# Patient Record
Sex: Female | Born: 1970 | Race: White | Hispanic: No | Marital: Married | Smoking: Never smoker
Health system: Southern US, Community
[De-identification: ages and names within clinical notes are randomized; demographics above are authoritative.]

## PROBLEM LIST (undated history)

## (undated) DIAGNOSIS — L02419 Cutaneous abscess of limb, unspecified: Secondary | ICD-10-CM

## (undated) DIAGNOSIS — K469 Unspecified abdominal hernia without obstruction or gangrene: Secondary | ICD-10-CM

## (undated) HISTORY — DX: Unspecified abdominal hernia without obstruction or gangrene: K46.9

## (undated) HISTORY — DX: Cutaneous abscess of limb, unspecified: L02.419

## (undated) HISTORY — PX: NEPHRECTOMY: SHX65

## (undated) HISTORY — PX: BREAST BIOPSY: SHX20

---

## 2006-05-18 ENCOUNTER — Other Ambulatory Visit: Admission: RE | Admit: 2006-05-18 | Discharge: 2006-05-18 | Payer: Self-pay | Admitting: Internal Medicine

## 2007-07-19 ENCOUNTER — Other Ambulatory Visit: Admission: RE | Admit: 2007-07-19 | Discharge: 2007-07-19 | Payer: Self-pay | Admitting: Internal Medicine

## 2008-07-24 ENCOUNTER — Other Ambulatory Visit: Admission: RE | Admit: 2008-07-24 | Discharge: 2008-07-24 | Payer: Self-pay | Admitting: Internal Medicine

## 2009-07-27 ENCOUNTER — Other Ambulatory Visit: Admission: RE | Admit: 2009-07-27 | Discharge: 2009-07-27 | Payer: Self-pay | Admitting: Internal Medicine

## 2009-08-05 ENCOUNTER — Encounter: Admission: RE | Admit: 2009-08-05 | Discharge: 2009-08-05 | Payer: Self-pay | Admitting: Internal Medicine

## 2010-09-24 ENCOUNTER — Other Ambulatory Visit: Payer: Self-pay | Admitting: Surgery

## 2010-09-24 ENCOUNTER — Encounter (HOSPITAL_COMMUNITY): Payer: Managed Care, Other (non HMO) | Attending: Surgery

## 2010-09-24 DIAGNOSIS — K439 Ventral hernia without obstruction or gangrene: Secondary | ICD-10-CM | POA: Insufficient documentation

## 2010-09-24 DIAGNOSIS — Z905 Acquired absence of kidney: Secondary | ICD-10-CM | POA: Insufficient documentation

## 2010-09-24 DIAGNOSIS — Z01812 Encounter for preprocedural laboratory examination: Secondary | ICD-10-CM | POA: Insufficient documentation

## 2010-09-24 LAB — BASIC METABOLIC PANEL
BUN: 9 mg/dL (ref 6–23)
CO2: 27 mEq/L (ref 19–32)
Chloride: 106 mEq/L (ref 96–112)
GFR calc Af Amer: >60 mL/min (ref >60)
GFR calc non Af Amer: 55 mL/min — ABNORMAL LOW (ref >60)
Glucose, Bld: 95 mg/dL (ref 70–99)
Potassium: 4.5 mEq/L (ref 3.5–5.1)
Sodium: 138 mEq/L (ref 135–145)

## 2010-09-24 LAB — CBC
HCT: 41.5 % (ref 36.0–46.0)
Platelets: 243 10*3/uL (ref 150–400)
RBC: 4.86 MIL/uL (ref 3.87–5.11)

## 2010-09-24 LAB — SURGICAL PCR SCREEN: Staphylococcus aureus: NEGATIVE

## 2010-09-24 LAB — HCG, SERUM, QUALITATIVE: Preg, Serum: NEGATIVE

## 2010-09-29 ENCOUNTER — Ambulatory Visit (HOSPITAL_COMMUNITY)
Admission: RE | Admit: 2010-09-29 | Discharge: 2010-09-30 | Disposition: A | Discharge status: Home / Self Care | Payer: Managed Care, Other (non HMO) | Source: Ambulatory Visit | Attending: Surgery | Admitting: Surgery

## 2010-09-29 DIAGNOSIS — K436 Other and unspecified ventral hernia with obstruction, without gangrene: Secondary | ICD-10-CM | POA: Insufficient documentation

## 2010-09-29 DIAGNOSIS — Z905 Acquired absence of kidney: Secondary | ICD-10-CM | POA: Insufficient documentation

## 2010-10-08 NOTE — Op Note (Signed)
Erin Sheppard, Erin Sheppard              ACCOUNT NO.:  [id number]  MEDICAL RECORD NO.:  [id number]           PATIENT TYPE:  O  LOCATION:  DAYL                         FACILITY:  [facility]  PHYSICIAN:  Thornton Park. 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  POSTOPERATIVE DIAGNOSIS:  Incarcerated hernia with omentum.  PROCEDURE:  Laparoscopic takedown of adhesions and incarceration, open cutdown and removal of sac, relaxing incisions through rectus abdominis with primary interrupted Novafil closure.  Prior to closure, insertion of a 12-cm round Parietex mesh patch anchored superiorly and inferiorly 2 cm above the defect with #1 Novafil, internal tacking with a SecureStrap absorbable tacker, onlay of Bard Marlex mesh on top of the areas of the relaxed incisions crossing the midline and across the defect.  Anchored with interrupted #1 Novafil.  SURGEON:  Thornton Park. Daphine Deutscher, MD  ASSISTANT:  None.  ANESTHESIA:  General endotracheal.  DESCRIPTION OF PROCEDURE:  Erin Sheppard is a 40 year old white female, brought to room 11 on Wednesday, Sep 29, 2010, and given general anesthesia.  Abdomen was prepped with PC max and draped sterilely.  I entered the abdomen through the left upper quadrant using a 5-mm 0- degree Optiview with a little difficulty and that I actually entered into a bunch of adhesions which were avascular.  I did not feel in any way that I injured the bowel, but looked afterward the fact.  These were omental adhesions all across the left upper quadrant and across to the extraction site and they even extended down into the pelvis, but I was able to insufflate and then I placed again using an Optiview technique, a 5 over on the right side and then 1 inferiorly.  These enabled me  to visualize the adhesions.  I took these down with Harmonic scalpel.  I found them stuck up into the hernia sac, and I again took those down with Harmonic and reduced them.  Everything looked to be in order. There was about a 6 cm defect in the midline.  I elected then to try to restore Erin Sheppard functional abdomen by reducing the muscles back together.  I excised Erin Sheppard old scar around Erin Sheppard umbilicus and then went out and dissected free this large hernia sac.  I removed it and then this revealed a nice fascial defect.  This point, 3 trocars stayed in place.  I went ahead and placed #1 Novafil sutures which would essentially repair the defect.  I went out laterally in the rectus sheath in this area and made an anterior relaxing incision to facilitate this less tension closure.  Prior to tying these, however, I decided that I would insert a 12 cm Parietex, and I oriented it with the coated side to the bowel and then placed 2 sutures 2 cm cephalad to the upper limits of the defect still leaving a little bit of mesh on the upside of that to give me probably about 3 cm above and below  coverage over the closure.  Once these were tied, the mesh was oriented in up and down direction.  I then tied all of these sutures approximating the midline defect nicely.  I then reinflated the abdomen.  I was able to use a SecureStrap to tack the Parietex mesh laterally using one and half tackers to get that done. It was lying nicely and covered the defect completely.  Next, I did put a piece of Bard  Marlex type mesh cutting it to fit right across the defect as well as wide enough to cover the 2 areas of relaxed incisions. I tacked these anterior fascia laterally with 2 sutures on either side and then the midline fascia with a single up and down stitch.  Wound was irrigated with copious saline.  When I put in the Marlex mesh, I had the abdomen deflated.  I then closed the wound with 4-0 Vicryl subcutaneously and  with a running subcuticular 5-0 Monocryl.  Dermabond was used on the incisions.  The 3 trocars incisions were closed with 4-0 Vicryl and with Dermabond.  The patient tolerated the procedure well, was taken to recovery room in satisfactory condition.  Erin Sheppard will be considered for discharge and will be given a prescription for Percocet if needed.     Thornton Park Daphine Deutscher, MD     MBM/MEDQ  D:  09/29/2010  T:  09/29/2010  Job:  [id number]  Electronically Signed by Luretha Murphy MD on 10/08/2010 08:35:42 AM

## 2010-10-08 NOTE — Discharge Summary (Signed)
  NAMEDEANNE, Erin Sheppard              ACCOUNT NO.:  [id number]  MEDICAL RECORD NO.:  [id number]           PATIENT TYPE:  O  LOCATION:  1533                         FACILITY:  [facility]  PHYSICIAN:  Thornton Park. Daphine Deutscher, MD  DATE OF BIRTH:  [birth year]  DATE OF ADMISSION:  09/29/2010 DATE OF DISCHARGE:  09/30/2010                              DISCHARGE SUMMARY   ADMITTING DIAGNOSIS:  Ventral hernia following a donor nephrectomy.  HISTORY:  This 40 year old white female came into the hospital on Sep 29, 2010, and underwent a laparoscopically-assisted ventral hernia repair using both Parietex internal placement and an external Bard mesh.  The patient tolerated the procedure well.  She looks good on postop day #1 and was ready for discharge.  She is be given Percocet 5/325 #30 to take for pain.  She will be followed up in the office in 2 to 3 weeks.  CONDITION ON DISCHARGE:  Good.     Thornton Park Daphine Deutscher, MD     MBM/MEDQ  D:  09/30/2010  T:  09/30/2010  Job:  [id number]  Electronically Signed by Luretha Murphy MD on 10/08/2010 08:35:39 AM

## 2010-11-12 ENCOUNTER — Encounter (INDEPENDENT_AMBULATORY_CARE_PROVIDER_SITE_OTHER): Payer: Self-pay | Admitting: Surgery

## 2010-12-23 ENCOUNTER — Ambulatory Visit (INDEPENDENT_AMBULATORY_CARE_PROVIDER_SITE_OTHER): Payer: Commercial Indemnity | Admitting: Surgery

## 2010-12-23 ENCOUNTER — Encounter (INDEPENDENT_AMBULATORY_CARE_PROVIDER_SITE_OTHER): Payer: Self-pay | Admitting: Surgery

## 2010-12-23 DIAGNOSIS — Z8719 Personal history of other diseases of the digestive system: Secondary | ICD-10-CM

## 2010-12-23 DIAGNOSIS — Z9889 Other specified postprocedural states: Secondary | ICD-10-CM

## 2010-12-23 NOTE — Progress Notes (Signed)
Mrs. Erin Sheppard was worked into and office since she was overlooked and. Anyway she was a good sport about having to wait to be seen.  I had repaired a hernia where she had a donor nephrectomy to get to her brother. Her brother is doing very well with her kidney. The repair she says sometimes pulls which I expect with her mesh shrinkage. The repair is intact and she seems to be doing well I will be glad to see her again as needed. Return p.r.n.

## 2011-09-06 ENCOUNTER — Other Ambulatory Visit: Payer: Self-pay | Admitting: Internal Medicine

## 2011-09-06 ENCOUNTER — Other Ambulatory Visit (HOSPITAL_COMMUNITY)
Admission: RE | Admit: 2011-09-06 | Discharge: 2011-09-06 | Disposition: A | Discharge status: Home / Self Care | Payer: Commercial Indemnity | Source: Ambulatory Visit | Attending: Internal Medicine | Admitting: Internal Medicine

## 2011-09-06 DIAGNOSIS — Z1159 Encounter for screening for other viral diseases: Secondary | ICD-10-CM | POA: Insufficient documentation

## 2011-09-06 DIAGNOSIS — Z1231 Encounter for screening mammogram for malignant neoplasm of breast: Secondary | ICD-10-CM

## 2011-09-06 DIAGNOSIS — Z01419 Encounter for gynecological examination (general) (routine) without abnormal findings: Secondary | ICD-10-CM | POA: Insufficient documentation

## 2011-09-14 ENCOUNTER — Ambulatory Visit
Admission: RE | Admit: 2011-09-14 | Discharge: 2011-09-14 | Disposition: A | Discharge status: Home / Self Care | Payer: Commercial Indemnity | Source: Ambulatory Visit | Attending: Internal Medicine | Admitting: Internal Medicine

## 2011-09-14 DIAGNOSIS — Z1231 Encounter for screening mammogram for malignant neoplasm of breast: Secondary | ICD-10-CM

## 2012-03-05 ENCOUNTER — Encounter (INDEPENDENT_AMBULATORY_CARE_PROVIDER_SITE_OTHER): Payer: Self-pay | Admitting: General Surgery

## 2012-03-05 ENCOUNTER — Ambulatory Visit (INDEPENDENT_AMBULATORY_CARE_PROVIDER_SITE_OTHER): Payer: Commercial Indemnity | Admitting: General Surgery

## 2012-03-05 VITALS — BP 132/76 | HR 76 | Temp 98.0°F | Resp 18 | Ht 66.0 in | Wt 184.4 lb

## 2012-03-05 DIAGNOSIS — IMO0002 Reserved for concepts with insufficient information to code with codable children: Secondary | ICD-10-CM

## 2012-03-05 DIAGNOSIS — L02419 Cutaneous abscess of limb, unspecified: Secondary | ICD-10-CM

## 2012-03-05 MED ORDER — HYDROCODONE-ACETAMINOPHEN 5-325 MG PO TABS: 1.0000 | ORAL_TABLET | Freq: Four times a day (QID) | ORAL | Status: DC | PRN

## 2012-03-05 NOTE — Progress Notes (Signed)
Patient ID: Erin Sheppard, female   DOB: 1970/06/08, 41 y.o.   MRN: [id number]  Chief Complaint  Patient presents with  . Follow-up    Lft Abcess    HPI Erin Sheppard is a 41 y.o. female.  HPIthis patient comes in today for evaluation of left axillary abscess. She says that for about 1-1/2 weeks she has had increasing swelling and tenderness and redness in her left axilla. She has had prior infections in the axilla but she has not had any prior incision and drainage. Sheath says over the weekend it got increasingly tender and she went to her primary care physician for evaluation who prescribed her with doxycycline and referred her here for incision and drainage. Otherwise she has no fevers or chills.  Past Medical History  Diagnosis Date  . Hernia     Past Surgical History  Procedure Date  . Cesarean section 8/96, 1/06,07/02  . Nephrectomy     Family History  Problem Relation Age of Onset  . Thyroid disease Mother   . Diabetes Father   . Heart disease Father   . Polycystic kidney disease Brother     Social History History  Substance Use Topics  . Smoking status: Never Smoker   . Smokeless tobacco: Not on file  . Alcohol Use: No    No Known Allergies  Current Outpatient Prescriptions  Medication Sig Dispense Refill  . calcium carbonate (OS-CAL) 600 MG TABS Take 600 mg by mouth 2 (two) times daily with a meal.        . doxycycline (VIBRAMYCIN) 100 MG capsule Take 100 mg by mouth 2 (two) times daily.        Review of Systems Review of Systems All other review of systems negative or noncontributory except as stated in the HPI  Blood pressure 132/76, pulse 76, temperature 98 F (36.7 C), temperature source Temporal, resp. rate 18, height 5\' 6"  (1.676 m), weight 184 lb 6.4 oz (83.643 kg).  Physical Exam Physical Exam Nad, nontoxic Left axillary tenderness and fluctuance with surrounding erythema.  She has a whitish punctum as well  Data  Reviewed   Assessment    Left axillary abscess After informed consent performed, area prepped with chg and lidocaine with epi 7ml infiltrated in field block.  Incised and purulence returned.  Cultures taken and wound packed. No apparent complications.     Plan    Doxycycline as prescribed and wound care.  Will see her back tomorrow for wound care teaching otherwise f/u in 1-2 weeks. Or sooner as needed.       Lodema Pilot DAVID 03/05/2012, 4:29 PM

## 2012-03-05 NOTE — Addendum Note (Signed)
Addended byLittie Deeds on: 03/05/2012 05:17 PM   Modules accepted: Orders

## 2012-03-06 ENCOUNTER — Ambulatory Visit (INDEPENDENT_AMBULATORY_CARE_PROVIDER_SITE_OTHER): Payer: Commercial Indemnity | Admitting: General Surgery

## 2012-03-06 ENCOUNTER — Encounter (INDEPENDENT_AMBULATORY_CARE_PROVIDER_SITE_OTHER): Payer: Self-pay

## 2012-03-06 VITALS — BP 108/68 | HR 76 | Temp 97.8°F | Resp 16 | Ht 66.0 in | Wt 185.8 lb

## 2012-03-06 DIAGNOSIS — Z5189 Encounter for other specified aftercare: Secondary | ICD-10-CM

## 2012-03-09 LAB — WOUND CULTURE

## 2012-03-16 ENCOUNTER — Ambulatory Visit (INDEPENDENT_AMBULATORY_CARE_PROVIDER_SITE_OTHER): Payer: Commercial Indemnity | Admitting: General Surgery

## 2012-03-16 VITALS — BP 108/68 | HR 70 | Temp 97.4°F | Resp 16 | Wt 185.2 lb

## 2012-03-16 DIAGNOSIS — L02419 Cutaneous abscess of limb, unspecified: Secondary | ICD-10-CM

## 2012-03-16 DIAGNOSIS — IMO0002 Reserved for concepts with insufficient information to code with codable children: Secondary | ICD-10-CM

## 2012-03-16 NOTE — Progress Notes (Signed)
Subjective:     Patient ID: Erin Sheppard, female   DOB: 12/26/1970, 41 y.o.   MRN: [id number]  HPI She is s/p I/D of axillary abscess.  Feels better. No fevers or chills.  Review of Systems     Objective:   Physical Exam NAD, Nontoxic The wound is healing well.  No residual infection.  The other small early abscesses have resolved as well.    Assessment:     S/p I/D of left axillary abscess-Improving The wound is essentially healed and she feels better.  She can return to regular activity and f/u with me PRN.  If she has a persistent skin cyst then I offered to perform elective excision to prevent recurrence.     Plan:     F/u prn

## 2013-09-23 ENCOUNTER — Other Ambulatory Visit: Payer: Self-pay

## 2013-09-23 DIAGNOSIS — Z1231 Encounter for screening mammogram for malignant neoplasm of breast: Secondary | ICD-10-CM

## 2013-10-01 ENCOUNTER — Encounter (INDEPENDENT_AMBULATORY_CARE_PROVIDER_SITE_OTHER): Payer: Self-pay

## 2013-10-01 ENCOUNTER — Ambulatory Visit
Admission: RE | Admit: 2013-10-01 | Discharge: 2013-10-01 | Disposition: A | Discharge status: Home / Self Care | Payer: Commercial Indemnity | Source: Ambulatory Visit

## 2013-10-01 DIAGNOSIS — Z1231 Encounter for screening mammogram for malignant neoplasm of breast: Secondary | ICD-10-CM

## 2013-10-03 ENCOUNTER — Other Ambulatory Visit: Payer: Self-pay | Admitting: Internal Medicine

## 2013-10-03 DIAGNOSIS — R928 Other abnormal and inconclusive findings on diagnostic imaging of breast: Secondary | ICD-10-CM

## 2013-10-15 ENCOUNTER — Ambulatory Visit
Admission: RE | Admit: 2013-10-15 | Discharge: 2013-10-15 | Disposition: A | Discharge status: Home / Self Care | Payer: Commercial Indemnity | Source: Ambulatory Visit | Attending: Internal Medicine | Admitting: Internal Medicine

## 2013-10-15 ENCOUNTER — Other Ambulatory Visit: Payer: Self-pay | Admitting: Internal Medicine

## 2013-10-15 DIAGNOSIS — R928 Other abnormal and inconclusive findings on diagnostic imaging of breast: Secondary | ICD-10-CM

## 2014-08-29 ENCOUNTER — Other Ambulatory Visit: Payer: Self-pay

## 2014-08-29 DIAGNOSIS — Z1231 Encounter for screening mammogram for malignant neoplasm of breast: Secondary | ICD-10-CM

## 2014-10-03 ENCOUNTER — Ambulatory Visit: Payer: Commercial Indemnity

## 2014-10-03 ENCOUNTER — Encounter (INDEPENDENT_AMBULATORY_CARE_PROVIDER_SITE_OTHER): Payer: Self-pay

## 2014-10-03 ENCOUNTER — Other Ambulatory Visit (HOSPITAL_COMMUNITY)
Admission: RE | Admit: 2014-10-03 | Discharge: 2014-10-03 | Disposition: A | Discharge status: Home / Self Care | Payer: Managed Care, Other (non HMO) | Source: Ambulatory Visit | Attending: Internal Medicine | Admitting: Internal Medicine

## 2014-10-03 ENCOUNTER — Other Ambulatory Visit (HOSPITAL_COMMUNITY): Payer: Self-pay | Admitting: Internal Medicine

## 2014-10-03 ENCOUNTER — Ambulatory Visit
Admission: RE | Admit: 2014-10-03 | Discharge: 2014-10-03 | Disposition: A | Discharge status: Home / Self Care | Payer: Managed Care, Other (non HMO) | Source: Ambulatory Visit

## 2014-10-03 DIAGNOSIS — Z1231 Encounter for screening mammogram for malignant neoplasm of breast: Secondary | ICD-10-CM

## 2014-10-03 DIAGNOSIS — Z01419 Encounter for gynecological examination (general) (routine) without abnormal findings: Secondary | ICD-10-CM | POA: Diagnosis present

## 2014-10-08 LAB — CYTOLOGY - PAP

## 2015-10-15 ENCOUNTER — Other Ambulatory Visit: Payer: Self-pay

## 2015-10-15 DIAGNOSIS — Z1231 Encounter for screening mammogram for malignant neoplasm of breast: Secondary | ICD-10-CM

## 2015-10-27 ENCOUNTER — Ambulatory Visit
Admission: RE | Admit: 2015-10-27 | Discharge: 2015-10-27 | Disposition: A | Discharge status: Home / Self Care | Payer: Managed Care, Other (non HMO) | Source: Ambulatory Visit

## 2015-10-27 DIAGNOSIS — Z1231 Encounter for screening mammogram for malignant neoplasm of breast: Secondary | ICD-10-CM

## 2016-11-03 ENCOUNTER — Other Ambulatory Visit: Payer: Self-pay | Admitting: Internal Medicine

## 2016-11-03 DIAGNOSIS — Z1231 Encounter for screening mammogram for malignant neoplasm of breast: Secondary | ICD-10-CM

## 2016-12-01 ENCOUNTER — Ambulatory Visit
Admission: RE | Admit: 2016-12-01 | Discharge: 2016-12-01 | Disposition: A | Discharge status: Home / Self Care | Payer: Managed Care, Other (non HMO) | Source: Ambulatory Visit | Attending: Internal Medicine | Admitting: Internal Medicine

## 2016-12-01 DIAGNOSIS — Z1231 Encounter for screening mammogram for malignant neoplasm of breast: Secondary | ICD-10-CM

## 2017-10-30 ENCOUNTER — Other Ambulatory Visit: Payer: Self-pay | Admitting: Internal Medicine

## 2017-10-30 DIAGNOSIS — Z1231 Encounter for screening mammogram for malignant neoplasm of breast: Secondary | ICD-10-CM

## 2017-11-16 ENCOUNTER — Other Ambulatory Visit: Payer: Self-pay | Admitting: Internal Medicine

## 2017-11-16 ENCOUNTER — Other Ambulatory Visit (HOSPITAL_COMMUNITY)
Admission: RE | Admit: 2017-11-16 | Discharge: 2017-11-16 | Disposition: A | Discharge status: Home / Self Care | Payer: Managed Care, Other (non HMO) | Source: Ambulatory Visit | Attending: Internal Medicine | Admitting: Internal Medicine

## 2017-11-16 DIAGNOSIS — Z124 Encounter for screening for malignant neoplasm of cervix: Secondary | ICD-10-CM | POA: Insufficient documentation

## 2017-11-21 LAB — CYTOLOGY - PAP
Diagnosis: NEGATIVE
HPV (WINDOPATH): NOT DETECTED

## 2017-12-07 ENCOUNTER — Ambulatory Visit
Admission: RE | Admit: 2017-12-07 | Discharge: 2017-12-07 | Disposition: A | Discharge status: Home / Self Care | Payer: Managed Care, Other (non HMO) | Source: Ambulatory Visit | Attending: Internal Medicine | Admitting: Internal Medicine

## 2017-12-07 DIAGNOSIS — Z1231 Encounter for screening mammogram for malignant neoplasm of breast: Secondary | ICD-10-CM

## 2018-03-29 ENCOUNTER — Encounter: Payer: Self-pay | Admitting: Internal Medicine

## 2018-04-02 ENCOUNTER — Ambulatory Visit (INDEPENDENT_AMBULATORY_CARE_PROVIDER_SITE_OTHER): Payer: Managed Care, Other (non HMO) | Admitting: Internal Medicine

## 2018-04-02 VITALS — BP 128/65 | HR 80 | Ht 66.0 in | Wt 208.0 lb

## 2018-04-02 DIAGNOSIS — E7800 Pure hypercholesterolemia, unspecified: Secondary | ICD-10-CM

## 2018-04-02 DIAGNOSIS — R42 Dizziness and giddiness: Secondary | ICD-10-CM

## 2018-04-02 DIAGNOSIS — Z905 Acquired absence of kidney: Secondary | ICD-10-CM | POA: Diagnosis not present

## 2018-04-02 DIAGNOSIS — R55 Syncope and collapse: Secondary | ICD-10-CM | POA: Diagnosis not present

## 2018-04-02 NOTE — Consult Note (Signed)
Cardiology Office Note:    Date:  04/02/2018   ID:  Erin Sheppard, DOB 12-25-1970, MRN [id number]  PCP:  Seward Carol, MD  Cardiologist:  No primary care provider on file.  Electrophysiologist:  None   Referring MD: Seward Carol, MD   Presyncope and palpitations  History of Present Illness:    Erin Sheppard is a 47 y.o. 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  She presents today with concerns of 8 weeks of presyncopal episodes with palpitations.  She denies current syncope.  She has had one syncopal episode in her lifetime approximately 20 years ago when she was having her haircut and had to stand.  She states that she will get a sense of lightheadedness and flushing with "head rush" and her vision will go black.  It only lasts for seconds, and she will check her heart rate on her Fitbit and will notice that it is elevated.  She checks her blood pressure at work and notes that it is normal after this episode.  She works as a Software engineer at [employer].  She has had 8 episodes like this over the past 2 months, 2 of which occurred while driving.  Fortunately she has not had an episode in the past 2 weeks.  Her symptoms do not seem to correlate with position change, any particular activities, exertion, or any life or medical stressors.  She denies chest pain, chest pressure, dyspnea at rest or with exertion, PND, orthopnea, or leg swelling. Denies dizziness.  Occasionally endorses snoring and has not be evaluated for sleep apnea.  Her Epworth Sleepiness Scale score is 2.  Her family history is significant for her father who had an MI at age 99, and her maternal grandfather had a stroke in his 67s.  There is no family history of sudden cardiac death, or unexplained drowning.  She is a never smoker, rare alcohol use, and no recreational drug use.  She does not take diet  supplements.  She does use red yeast rice extract for cholesterol treatment and flaxseed oil.  She has had uneventful pregnancies with no preeclampsia, eclampsia, or gestational diabetes.  Past Medical History:  Diagnosis Date  . Axillary abscess   . Hernia     Past Surgical History:  Procedure Laterality Date  . CESAREAN SECTION  8/96, 1/06,07/02  . NEPHRECTOMY      Current Medications: Current Meds  Medication Sig  . calcium carbonate (OS-CAL) 600 MG TABS Take 600 mg by mouth 2 (two) times daily with a meal.    . Flaxseed, Linseed, (FLAXSEED OIL) 1000 MG CAPS Take 1 capsule by mouth 2 (two) times daily.  . Red Yeast Rice 600 MG CAPS Take 1 capsule by mouth 2 (two) times daily.     Allergies:   Patient has no known allergies.   Social History   Socioeconomic History  . Marital status: Married    Spouse name: Not on file  . Number of children: Not on file  . Years of education: Not on file  . Highest education level: Not on file  Occupational History  . Occupation: Marine scientist: Public house manager  Social Needs  . Financial resource strain: Not on file  . Food insecurity:    Worry: Not on file    Inability: Not on file  . Transportation needs:    Medical: Not on file  Non-medical: Not on file  Tobacco Use  . Smoking status: Never Smoker  Substance and Sexual Activity  . Alcohol use: No  . Drug use: No  . Sexual activity: Not on file  Lifestyle  . Physical activity:    Days per week: Not on file    Minutes per session: Not on file  . Stress: Not on file  Relationships  . Social connections:    Talks on phone: Not on file    Gets together: Not on file    Attends religious service: Not on file    Active member of club or organization: Not on file    Attends meetings of clubs or organizations: Not on file    Relationship status: Not on file  Other Topics Concern  . Not on file  Social History Narrative  . Not on file     Family History: The  patient's family history includes Diabetes in her father; Heart disease in her father; Polycystic kidney disease in her brother; Thyroid disease in her mother.  ROS:   Please see the history of present illness.    All other systems reviewed and are negative.  EKGs/Labs/Other Studies Reviewed:    The following studies were reviewed today:  EKG:  EKG is ordered today.  The ekg ordered today demonstrates normal sinus rhythm with sinus arrhythmia.  Low voltage QRS.  Ventricular rate 77.  Recent Labs: No results found for requested labs within last 8760 hours.  Recent Lipid Panel No results found for: CHOL, TRIG, HDL, CHOLHDL, VLDL, LDLCALC, LDLDIRECT  Physical Exam:    VS:  BP 128/65   Pulse 80   Ht 5\' 6"  (1.676 m)   Wt 208 lb (94.3 kg)   BMI 33.57 kg/m     Wt Readings from Last 3 Encounters:  04/02/18 208 lb (94.3 kg)  03/16/12 185 lb 3.2 oz (84 kg)  03/06/12 185 lb 12.8 oz (84.3 kg)     Constitutional: No acute distress Eyes: pupils equally round and reactive to light, sclera non-icteric, normal conjunctiva and lids ENMT: normal dentition, moist mucous membranes Cardiovascular: regular rhythm, normal rate, no murmurs. S1 and S2 normal. Radial pulses normal bilaterally. No jugular venous distention.  Respiratory: clear to auscultation bilaterally GI : normal bowel sounds, soft and nontender. No distention.   MSK: extremities warm, well perfused. No edema.  NEURO: grossly nonfocal exam, moves all extremities. PSYCH: alert and oriented x 3, normal mood and affect.   ASSESSMENT:    1. Lightheadedness   2. Near syncope   3. H/O left nephrectomy   4. Pure hypercholesterolemia    PLAN:    1. Lightheadedness   2. Near syncope   3. H/O left nephrectomy   4. Pure hypercholesterolemia    She has had concerning sounding episodes of presyncope and lightheadedness.  Fortunately she has not had syncope with these episodes.  I have encouraged her not to drive so as to avoid an  episode while on the road.  I would like to obtain an echocardiogram looking for outflow tract obstruction and cardiomyopathic features.  I also think we should get a 30-day real-time event monitor so that we have a better understanding of what these episodes represent from a rhythm standpoint.  She has no concerning family history of cardiopulmonary disease, and I can only imagine that she had thorough testing prior to being a kidney donor.  Is unclear if there is a cardiovascular etiology to the current symptoms.  However if  her echocardiogram and monitor unrevealing we could consider pursuing tilt table testing for further evaluation.  We could also consider performing an ambulatory blood pressure monitor in case her flushing episodes correspond with hypertension.  We will start with an echocardiogram and 30-day monitor and I will see the patient back afterward for further evaluation.  She has an elevated LDL cholesterol of 141 and is currently treating with red yeast rice and flaxseed oil.  We have discussed indications for statin therapy.  Once we have resolved the issues of presyncope we can address her hypercholesterolemia in more detail.  Medication Adjustments/Labs and Tests Ordered: Current medicines are reviewed at length with the patient today.  Concerns regarding medicines are outlined above.  Orders Placed This Encounter  Procedures  . Cardiac event monitor  . EKG 12-Lead  . ECHOCARDIOGRAM COMPLETE   No orders of the defined types were placed in this encounter.   Patient Instructions  Medication Instructions:  Your physician recommends that you continue on your current medications as directed. Please refer to the Current Medication list given to you today.  If you need a refill on your cardiac medications before your next appointment, please call your pharmacy.   Lab work: None ordered If you have labs (blood work) drawn today and your tests are completely normal, you will  receive your results only by: Marland Kitchen MyChart Message (if you have MyChart) OR . A paper copy in the mail If you have any lab test that is abnormal or we need to change your treatment, we will call you to review the results.  Testing/Procedures: Your physician has requested that you have an echocardiogram. Echocardiography is a painless test that uses sound waves to create images of your heart. It provides your doctor with information about the size and shape of your heart and how well your heart's chambers and valves are working. This procedure takes approximately one hour. There are no restrictions for this procedure.  Your physician has recommended that you wear an event monitor. Event monitors are medical devices that record the heart's electrical activity. Doctors most often [place] these monitors to diagnose arrhythmias. Arrhythmias are problems with the speed or rhythm of the heartbeat. The monitor is a small, portable device. You can wear one while you do your normal daily activities. This is usually used to diagnose what is causing palpitations/syncope (passing out).    Follow-Up: At [facility], you and your health needs are our priority.  As part of our continuing mission to provide you with exceptional heart care, we have created designated Provider Care Teams.  These Care Teams include your primary Cardiologist (physician) and Advanced Practice Providers (APPs -  Physician Assistants and Nurse Practitioners) who all work together to provide you with the care you need, when you need it. Your physician recommends that you schedule a follow-up appointment in: 5-6 weeks   Any Other Special Instructions Will Be Listed Below (If Applicable). Please avoid driving until your cardiovascular testing is complete        Signed, Elouise Munroe, MD  04/02/2018 1:57 PM    [facility]

## 2018-04-02 NOTE — Patient Instructions (Signed)
Medication Instructions:  Your physician recommends that you continue on your current medications as directed. Please refer to the Current Medication list given to you today.  If you need a refill on your cardiac medications before your next appointment, please call your pharmacy.   Lab work: None ordered If you have labs (blood work) drawn today and your tests are completely normal, you will receive your results only by: Marland Kitchen MyChart Message (if you have MyChart) OR . A paper copy in the mail If you have any lab test that is abnormal or we need to change your treatment, we will call you to review the results.  Testing/Procedures: Your physician has requested that you have an echocardiogram. Echocardiography is a painless test that uses sound waves to create images of your heart. It provides your doctor with information about the size and shape of your heart and how well your heart's chambers and valves are working. This procedure takes approximately one hour. There are no restrictions for this procedure.  Your physician has recommended that you wear an event monitor. Event monitors are medical devices that record the heart's electrical activity. Doctors most often [place] these monitors to diagnose arrhythmias. Arrhythmias are problems with the speed or rhythm of the heartbeat. The monitor is a small, portable device. You can wear one while you do your normal daily activities. This is usually used to diagnose what is causing palpitations/syncope (passing out).    Follow-Up: At [facility], you and your health needs are our priority.  As part of our continuing mission to provide you with exceptional heart care, we have created designated Provider Care Teams.  These Care Teams include your primary Cardiologist (physician) and Advanced Practice Providers (APPs -  Physician Assistants and Nurse Practitioners) who all work together to provide you with the care you need, when you need it. Your physician  recommends that you schedule a follow-up appointment in: 5-6 weeks   Any Other Special Instructions Will Be Listed Below (If Applicable). Please avoid driving until your cardiovascular testing is complete

## 2018-04-04 ENCOUNTER — Ambulatory Visit (INDEPENDENT_AMBULATORY_CARE_PROVIDER_SITE_OTHER): Payer: Managed Care, Other (non HMO)

## 2018-04-04 ENCOUNTER — Other Ambulatory Visit: Payer: Self-pay

## 2018-04-04 ENCOUNTER — Ambulatory Visit (HOSPITAL_COMMUNITY): Payer: Managed Care, Other (non HMO) | Attending: Cardiology

## 2018-04-04 ENCOUNTER — Telehealth: Payer: Self-pay

## 2018-04-04 DIAGNOSIS — R55 Syncope and collapse: Secondary | ICD-10-CM | POA: Insufficient documentation

## 2018-04-04 DIAGNOSIS — R42 Dizziness and giddiness: Secondary | ICD-10-CM | POA: Diagnosis not present

## 2018-04-04 NOTE — Telephone Encounter (Signed)
Pt aware of echo results with verbal understanding. 

## 2018-04-04 NOTE — Telephone Encounter (Signed)
-----   Message from Elouise Munroe, MD sent at 04/04/2018  1:54 PM EST ----- Normal echo

## 2018-04-04 NOTE — Telephone Encounter (Signed)
Called to give pt echo results.lmtcb 

## 2018-04-05 ENCOUNTER — Telehealth: Payer: Self-pay | Admitting: Physician Assistant

## 2018-04-05 NOTE — Telephone Encounter (Signed)
Pt had 6 second run of VT. [employer] contacted the practice to advise [place]. They will fax strips to hospital and to office.  Spoke w/ Dr Carilyn Goodpasture, make sure pt ok right now, she will contact the pt in the morning for more definitive treatment.  Contacted pt.   She was asymptomatic, may have had a twinge of chest pain but no presyncope or other sx.  Called in Toprol XL 25 mg qd, start tonight.  Take sx seriously, low threshold to come to ER and do not drive if sx last longer than a few seconds.   Pt doing well at this time.  Rosaria Ferries, PA-C 04/05/2018 6:45 PM Beeper [phone]

## 2018-04-06 ENCOUNTER — Telehealth: Payer: Self-pay

## 2018-04-06 DIAGNOSIS — I472 Ventricular tachycardia, unspecified: Secondary | ICD-10-CM

## 2018-04-06 MED ORDER — METOPROLOL SUCCINATE ER 25 MG PO TB24: 25.0000 mg | ORAL_TABLET | Freq: Every day | ORAL | 5 refills | Status: DC

## 2018-04-06 NOTE — Telephone Encounter (Addendum)
Called to ck on pt. [employer] contacted our on call service to report a 6 sec run of VTach on 04/05/18 after office hours. Lauretta Chester, PA was the provider on call and also notified Dr.Acharya.  Pt sts that she is doing well and is asymptomatic She was able to pick up the Metoprolol XL 25mg  and start last night as instructed by Suanne Marker. Pt sts that she has not has any reoccurrence of palpitations. Adv pt that Dr. Margaretann Loveless would like her to have consult with one of our EP Cardiologist. Adv her that a scheduler will call her to schedule. Pt knows to go to the ED if symptoms develop.

## 2018-04-06 NOTE — Progress Notes (Addendum)
I attempted to reach Erin Sheppard by phone today at 10 am regarding the findings on her event monitor.  She had a 6-second episode of ventricular tachycardia yesterday evening at a heart rate of 174 bpm.  She also had frequent PVCs, occurring close to the T wave of the preceding beat.  She was called right away by Rosaria Ferries, PA.  She was asymptomatic at that time.  A prescription for Toprol-XL 25 mg daily was called in, and I was informed immediately.  We spoke to the patient earlier this morning, she is doing well and continues to be asymptomatic, and we informed her that we would like for her to see electrophysiology quite soon.  An appointment was arranged for 04/09/2018 with Dr. Thompson Grayer.  I tried to reach the patient on her cell phone today regarding the finding of ventricular tachycardia in the setting of presyncope, and the seriousness this implies.  I was unable to reach her and left a voicemail discouraging her from travel, and to avoid strenuous activities until her electrophysiology appointment.  She is already aware that she should not drive.  I have encouraged her to call the office when she receives my voicemail for further restrictions and instructions.  Erin Sheppard 04/06/18 11:52 AM

## 2018-04-09 ENCOUNTER — Institutional Professional Consult (permissible substitution): Payer: Managed Care, Other (non HMO) | Admitting: Internal Medicine

## 2018-04-09 ENCOUNTER — Telehealth: Payer: Self-pay | Admitting: *Deleted

## 2018-04-09 NOTE — Telephone Encounter (Addendum)
Received notification from [place].   Sinus tachycardia with run of Vtach (24 beats)/PVCs (16 in 1 min).  HR 181-occurred 11/7 at 7:52 pm.  Auto trigger.    Spoke to patient, she was unaware and completely asymptomatic.   She start metoprolol succinate 25 mg daily on 11/8 due to episode (see phone note).  She is scheduled to see EP 11/18.  offered to try to get this appt sooner but patient is in [place] until Wednesday night.    Reviewed with DOD, Dr. Ellyn Hack.   No further recommendations. Continue medication, keep EP appt, do not drive and go to ER for new or worsening symptoms as previously instructed.  Patient aware and verbalized understanding.   Routed to Dr. Margaretann Loveless to make aware.

## 2018-04-16 ENCOUNTER — Encounter: Payer: Self-pay | Admitting: Internal Medicine

## 2018-04-16 ENCOUNTER — Ambulatory Visit (INDEPENDENT_AMBULATORY_CARE_PROVIDER_SITE_OTHER): Payer: Managed Care, Other (non HMO) | Admitting: Internal Medicine

## 2018-04-16 VITALS — BP 114/78 | HR 65 | Ht 66.0 in | Wt 197.6 lb

## 2018-04-16 DIAGNOSIS — R55 Syncope and collapse: Secondary | ICD-10-CM

## 2018-04-16 DIAGNOSIS — R002 Palpitations: Secondary | ICD-10-CM | POA: Diagnosis not present

## 2018-04-16 DIAGNOSIS — R0602 Shortness of breath: Secondary | ICD-10-CM | POA: Diagnosis not present

## 2018-04-16 DIAGNOSIS — R079 Chest pain, unspecified: Secondary | ICD-10-CM | POA: Diagnosis not present

## 2018-04-16 MED ORDER — METOPROLOL SUCCINATE ER 50 MG PO TB24: 50.0000 mg | ORAL_TABLET | Freq: Every day | ORAL | 11 refills | Status: DC

## 2018-04-16 NOTE — Patient Instructions (Addendum)
Medication Instructions:  Your physician has recommended you make the following change in your medication:   1.  Increase your Toprol XL--We are increasing your dose to 50 mg by mouth daily.  You may take 2 tabs of your Toprol XL 25 mg tabs until they are gone.  Labwork: None ordered.  Testing/Procedures: Your physician has requested that you have an exercise tolerance test. For further information please visit [email or web address]. Please also follow instruction sheet, as given.  Please schedule for exercise tolerance test.  Follow-Up: Your physician wants you to follow-up in: 4 weeks with Dr. Rayann Heman.      Any Other Special Instructions Will Be Listed Below (If Applicable).  If you need a refill on your cardiac medications before your next appointment, please call your pharmacy.

## 2018-04-16 NOTE — Progress Notes (Signed)
Electrophysiology Office Note   Date:  04/16/2018   ID:  Erin Sheppard, DOB January 28, 1971, MRN [id number]  PCP:  Seward Carol, MD  Cardiologist:  Dr Margaretann Loveless Primary Electrophysiologist: Thompson Grayer, MD    CC: [place]   History of Present Illness: Erin Sheppard is a 47 y.o. female who presents today for electrophysiology evaluation.   She is referred by Dr Schuyler Amor for EP consultation regarding ventricular arrhythmias.  She reports being in good health, without significant PMH.  Over the past 2 months, she has noticed frequent episodes of tachypalpitations.  These typically last only several seconds and spontaneously resolve.  She is unaware of triggers/ precipitants.  She reports chest heaviness with lightheadedness and presyncope associated with episodes.  She has not had syncope. She reports having multiple episodes each day.  She is wearing an event monitor which has revealed NSVT and also PVCs.  She has been started on toprol but continues to have episodes. She is not very active and does not exercise regularly.  She has occasional SOB and chest heaviness but thinks that this is more related to arrhythmia than exertion.  Today, she denies symptoms of chest pain,  orthopnea, PND, lower extremity edema, claudication, syncope, bleeding, or neurologic sequela. The patient is tolerating medications without difficulties and is otherwise without complaint today.    Past Medical History:  Diagnosis Date  . Axillary abscess   . Hernia    Past Surgical History:  Procedure Laterality Date  . CESAREAN SECTION  8/96, 1/06,07/02  . NEPHRECTOMY       Current Outpatient Medications  Medication Sig Dispense Refill  . calcium carbonate (OS-CAL) 600 MG TABS Take 600 mg by mouth 2 (two) times daily with a meal.      . Flaxseed, Linseed, (FLAXSEED OIL) 1000 MG CAPS Take 1 capsule by mouth 2 (two) times daily.    . Red Yeast Rice 600 MG CAPS Take 1 capsule by mouth 2 (two) times daily.    .  metoprolol succinate (TOPROL XL) 50 MG 24 hr tablet Take 1 tablet (50 mg total) by mouth daily. Take with or immediately following a meal. 30 tablet 11   No current facility-administered medications for this visit.     Allergies:   Patient has no known allergies.   Social History:  The patient  reports that she has never smoked. She has never used smokeless tobacco. She reports that she does not drink alcohol or use drugs.   Family History:  The patient's family history includes Diabetes in her father; Heart disease in her father; Polycystic kidney disease in her brother; Thyroid disease in her mother.  Denies FH of cardiomyopathy, arrhythmia, sudden death, or syncope.  ROS:  Please see the history of present illness.   All other systems are personally reviewed and negative.    PHYSICAL EXAM: VS:  BP 114/78   Pulse 65   Ht 5\' 6"  (1.676 m)   Wt 197 lb 9.6 oz (89.6 kg)   SpO2 98%   BMI 31.89 kg/m  , BMI Body mass index is 31.89 kg/m. GEN: Well nourished, well developed, in no acute distress  HEENT: normal  Neck: no JVD, carotid bruits, or masses Cardiac: RRR; no murmurs, rubs, or gallops,no edema  Respiratory:  clear to auscultation bilaterally, normal work of breathing GI: soft, nontender, nondistended, + BS MS: no deformity or atrophy  Skin: warm and dry  Neuro:  Strength and sensation are intact Psych: euthymic mood, full affect  EKG:  EKG is ordered today. The ekg ordered today is personally reviewed and shows sinus rhythm 65 bpm, PR 162 msec, QRS 90 msec, QTc 401 msec, poor r wave transition across the precordium   Recent Labs: No results found for requested labs within last 8760 hours.  personally reviewed   Lipid Panel  No results found for: CHOL, TRIG, HDL, CHOLHDL, VLDL, LDLCALC, LDLDIRECT personally reviewed   Wt Readings from Last 3 Encounters:  04/16/18 197 lb 9.6 oz (89.6 kg)  04/02/18 208 lb (94.3 kg)  03/16/12 185 lb 3.2 oz (84 kg)      Other studies  personally reviewed: Additional studies/ records that were reviewed today include: echo, Dr Lawerance Bach notes  Review of the above records today demonstrates: as above  Currently wearing her monitor and tracings are not available to review   ASSESSMENT AND PLAN:  1.  PVCs, NSVT She has palpitations and presyncope.  Per Dr Schuyler Amor, NSVT and PVCs appear to be the cause.  I do not have strips available to review at this time. We discussed options at length today. I will increase toprol to 50mg  daily.  Could consider flecainide if episodes continue. I will order exercise treadmill to evaluate for exertional arrhythmias and also to evaluate for ischemia. We discussed ablation, however given relatively infrequent nature, I am not sure that her arrhythmia is amenable to 3D mapping at this time.  Follow-up:  Return in 4 weeks to follow-up on monitor, treadmill, and assess response to beta blocker therapy.  Current medicines are reviewed at length with the patient today.   The patient does not have concerns regarding her medicines.  The following changes were made today:  none  Labs/ tests ordered today include:  Orders Placed This Encounter  Procedures  . Exercise Tolerance Test  . EKG 12-Lead    Signed, Thompson Grayer, MD  04/16/2018 9:17 AM     [facility] [address] [place] [address] [phone] (office) [phone] (fax)

## 2018-04-20 ENCOUNTER — Ambulatory Visit (INDEPENDENT_AMBULATORY_CARE_PROVIDER_SITE_OTHER): Payer: Managed Care, Other (non HMO)

## 2018-04-20 DIAGNOSIS — R0602 Shortness of breath: Secondary | ICD-10-CM | POA: Diagnosis not present

## 2018-04-20 DIAGNOSIS — R079 Chest pain, unspecified: Secondary | ICD-10-CM | POA: Diagnosis not present

## 2018-04-21 LAB — EXERCISE TOLERANCE TEST
CHL CUP MPHR: 172 {beats}/min
CHL CUP RESTING HR STRESS: 68 {beats}/min
CHL RATE OF PERCEIVED EXERTION: 19
Estimated workload: 10.1 METS
Exercise duration (min): 9 min
Exercise duration (sec): 0 s
Peak HR: 166 {beats}/min
Percent HR: 97 %

## 2018-04-24 ENCOUNTER — Telehealth: Payer: Self-pay

## 2018-04-24 NOTE — Telephone Encounter (Signed)
-----   Message from Thompson Grayer, MD sent at 04/22/2018  9:00 PM EST ----- Results reviewed.  Sonia Baller, please inform pt of result.   No changes at this time.  Will consider flecainide upon return. I will route to primary care also.

## 2018-04-24 NOTE — Telephone Encounter (Signed)
Outreach made to Pt.  Advised of results of stress test and Dr. Rayann Heman remarks.  Pt indicates understanding.    No questions at this time.

## 2018-05-03 ENCOUNTER — Encounter: Payer: Self-pay | Admitting: Internal Medicine

## 2018-05-09 ENCOUNTER — Ambulatory Visit: Payer: Managed Care, Other (non HMO) | Admitting: Internal Medicine

## 2018-05-16 ENCOUNTER — Other Ambulatory Visit: Payer: Self-pay

## 2018-05-16 ENCOUNTER — Encounter: Payer: Self-pay | Admitting: Internal Medicine

## 2018-05-16 ENCOUNTER — Ambulatory Visit (INDEPENDENT_AMBULATORY_CARE_PROVIDER_SITE_OTHER): Payer: Managed Care, Other (non HMO) | Admitting: Internal Medicine

## 2018-05-16 VITALS — BP 132/88 | HR 72 | Ht 66.0 in | Wt 203.8 lb

## 2018-05-16 DIAGNOSIS — R002 Palpitations: Secondary | ICD-10-CM

## 2018-05-16 DIAGNOSIS — I472 Ventricular tachycardia, unspecified: Secondary | ICD-10-CM

## 2018-05-16 NOTE — Progress Notes (Signed)
   PCP: Seward Carol, MD Primary Cardiologist: Dr Schuyler Amor Primary EP: Dr Rayann Heman  Erin Sheppard is a 47 y.o. female who presents today for routine electrophysiology followup.  Since last being seen in our clinic, the patient reports doing very well. She has 4 children.  She is excited about going to the [place] with her son who is a Administrator, arts at [employer] over the holidays.  Her PVCs are symptomatically much improved.  Today, she denies symptoms of palpitations, chest pain, shortness of breath,  lower extremity edema, dizziness, presyncope, or syncope.  The patient is otherwise without complaint today.   Past Medical History:  Diagnosis Date  . Axillary abscess   . Hernia    Past Surgical History:  Procedure Laterality Date  . CESAREAN SECTION  8/96, 1/06,07/02  . NEPHRECTOMY      ROS- all systems are reviewed and negatives except as per HPI above  Current Outpatient Medications  Medication Sig Dispense Refill  . calcium carbonate (OS-CAL) 600 MG TABS Take 600 mg by mouth 2 (two) times daily with a meal.      . Flaxseed, Linseed, (FLAXSEED OIL) 1000 MG CAPS Take 1 capsule by mouth 2 (two) times daily.    . metoprolol succinate (TOPROL XL) 50 MG 24 hr tablet Take 1 tablet (50 mg total) by mouth daily. Take with or immediately following a meal. 30 tablet 11  . Red Yeast Rice 600 MG CAPS Take 1 capsule by mouth 2 (two) times daily.     No current facility-administered medications for this visit.     Physical Exam: Vitals:   05/16/18 1151  BP: 132/88  Pulse: 72  SpO2: 98%  Weight: 203 lb 12.8 oz (92.4 kg)  Height: 5\' 6"  (1.676 m)    GEN- The patient is well appearing, alert and oriented x 3 today.   Head- normocephalic, atraumatic Eyes-  Sclera clear, conjunctiva pink Ears- hearing intact Oropharynx- clear Lungs- Clear to ausculation bilaterally, normal work of breathing Heart- Regular rate and rhythm, no murmurs, rubs or gallops, PMI not laterally displaced GI- soft, NT,  ND, + BS Extremities- no clubbing, cyanosis, or edema  Wt Readings from Last 3 Encounters:  05/16/18 203 lb 12.8 oz (92.4 kg)  04/16/18 197 lb 9.6 oz (89.6 kg)  04/02/18 208 lb (94.3 kg)      Assessment and Plan:  1. PVCs/ NSVT Doing well with toprol She does not wish to make any changes could consider flecainide if symptoms worsen   Thompson Grayer MD, [facility] 05/16/2018 12:21 PM

## 2018-05-16 NOTE — Patient Instructions (Addendum)
Medication Instructions:  Your physician recommends that you continue on your current medications as directed. Please refer to the Current Medication list given to you today.  * If you need a refill on your cardiac medications before your next appointment, please call your pharmacy.   Labwork: None ordered  Testing/Procedures: None ordered  Follow-Up: Your physician recommends that you schedule a follow-up appointment in: 3 months with Tommye Standard, PA.  Thank you for choosing [facility]!!

## 2018-07-31 ENCOUNTER — Encounter: Payer: Self-pay | Admitting: Internal Medicine

## 2018-08-17 ENCOUNTER — Ambulatory Visit: Payer: Managed Care, Other (non HMO) | Admitting: Physician Assistant

## 2018-08-23 ENCOUNTER — Telehealth: Payer: Self-pay

## 2018-08-23 NOTE — Telephone Encounter (Signed)
Spoke with pt regarding virtual visit on 08/24/18. Pt was sent a consent form through MyChart and explained the steps to set up virtual visit. Pt questions and concerns were address.

## 2018-08-24 ENCOUNTER — Telehealth (INDEPENDENT_AMBULATORY_CARE_PROVIDER_SITE_OTHER): Payer: Managed Care, Other (non HMO) | Admitting: Internal Medicine

## 2018-08-24 ENCOUNTER — Other Ambulatory Visit: Payer: Self-pay

## 2018-08-24 DIAGNOSIS — I493 Ventricular premature depolarization: Secondary | ICD-10-CM | POA: Diagnosis not present

## 2018-08-24 DIAGNOSIS — I4729 Other ventricular tachycardia: Secondary | ICD-10-CM

## 2018-08-24 DIAGNOSIS — I472 Ventricular tachycardia: Secondary | ICD-10-CM | POA: Diagnosis not present

## 2018-08-24 NOTE — Progress Notes (Signed)
Electrophysiology TeleHealth Note   Due to national recommendations of social distancing due to COVID 19, an audio/video telehealth visit is felt to be most appropriate for this patient at this time.  See MyChart message from today for the patient's consent to telehealth for [facility].   Date:  08/24/2018   ID:  Erin Sheppard, DOB April 10, 1971, MRN [id number]  Location: patient's home  Provider location: [address] [place]  Evaluation Performed: Follow-up visit  PCP:  Seward Carol, MD  Cardiologist:  No primary care provider on file.  Electrophysiologist:  Dr Rayann Heman  Chief Complaint:  palpitations  History of Present Illness:    Erin Sheppard is a 48 y.o. female who presents via audio/video conferencing for a telehealth visit today.  Since last being seen in our clinic, the patient reports doing very well.  We talked at length about our children and COVID 19.  Her daughter is a Equities trader in high school and is quite unhappy with missing school.   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  Today, she denies symptoms of palpitations, chest pain, shortness of breath,  lower extremity edema, dizziness, presyncope, or syncope.  The patient is otherwise without complaint today.  The patient denies symptoms of fevers, chills, cough, or new SOB worrisome for COVID 19.   Past Medical History:  Diagnosis Date  . Axillary abscess   . Hernia     Past Surgical History:  Procedure Laterality Date  . CESAREAN SECTION  8/96, 1/06,07/02  . NEPHRECTOMY      Current Outpatient Medications  Medication Sig Dispense Refill  . calcium carbonate (OS-CAL) 600 MG TABS Take 600 mg by mouth 2 (two) times daily with a meal.      . Flaxseed, Linseed, (FLAXSEED OIL) 1000 MG CAPS Take 1 capsule by mouth 2 (two) times daily.    . metoprolol succinate (TOPROL XL) 50 MG 24 hr tablet Take 1 tablet (50 mg total) by mouth daily. Take with or immediately following a  meal. 30 tablet 11  . Red Yeast Rice 600 MG CAPS Take 1 capsule by mouth 2 (two) times daily.     No current facility-administered medications for this visit.     Allergies:   Patient has no known allergies.   Social History:  The patient  reports that she has never smoked. She has never used smokeless tobacco. She reports that she does not drink alcohol or use drugs.   Family History:  The patient's  family history includes Diabetes in her father; Heart disease in her father; Polycystic kidney disease in her brother; Thyroid disease in her mother.   ROS:  Please see the history of present illness.   All other systems are personally reviewed and negative.    Exam:    Vital Signs:  BP 115/75   Pulse 74    Well appearing, alert and conversant, regular work of breathing,  good skin color Eyes- anicteric, neuro- grossly intact, skin- no apparent rash or lesions or cyanosis, mouth- oral mucosa is pink   Labs/Other Tests and Data Reviewed:    Recent Labs: No results found for requested labs within last 8760 hours.   Wt Readings from Last 3 Encounters:  05/16/18 203 lb 12.8 oz (92.4 kg)  04/16/18 197 lb 9.6 oz (89.6 kg)  04/02/18 208 lb (94.3 kg)     Other studies personally reviewed: Additional studies/ records that were reviewed today include: my  prior office notes  Review of the above records today demonstrates: as above Prior radiographs: na   ekg 04/16/18- sinus rhythm, no pvcs  ASSESSMENT & PLAN:    1.  PVCs/ NSVT Well controlled We discussed that she may have increased ectopy during stressful tiimes of illlness. She could take additional metoprolol 25mg  if needed. Adequate hydration is also encouraged.  2. COVID 19 screen The patient denies symptoms of COVID 19 at this time.  The importance of social distancing was discussed today.  Follow-up:  6 months  Current medicines are reviewed at length with the patient today.   The patient does not have concerns  regarding her medicines.  The following changes were made today:  none  Labs/ tests ordered today include:  No orders of the defined types were placed in this encounter.    Patient Risk:  after full review of this patients clinical status, I feel that they are at moderate risk at this time.  Today, I have spent 20 minutes with the patient with telehealth technology discussing PVCs/ NSVT and covid risks .    Army Fossa, MD  08/24/2018 9:21 AM     [facility] [address] [place] [address] [phone] (office) [phone] (fax)

## 2018-08-29 ENCOUNTER — Ambulatory Visit: Payer: Managed Care, Other (non HMO) | Admitting: Internal Medicine

## 2019-01-01 ENCOUNTER — Other Ambulatory Visit: Payer: Self-pay | Admitting: Internal Medicine

## 2019-01-01 DIAGNOSIS — Z1231 Encounter for screening mammogram for malignant neoplasm of breast: Secondary | ICD-10-CM

## 2019-01-23 ENCOUNTER — Other Ambulatory Visit (HOSPITAL_BASED_OUTPATIENT_CLINIC_OR_DEPARTMENT_OTHER): Payer: Self-pay | Admitting: Internal Medicine

## 2019-01-23 DIAGNOSIS — Z1231 Encounter for screening mammogram for malignant neoplasm of breast: Secondary | ICD-10-CM

## 2019-01-24 ENCOUNTER — Ambulatory Visit (HOSPITAL_BASED_OUTPATIENT_CLINIC_OR_DEPARTMENT_OTHER)
Admission: RE | Admit: 2019-01-24 | Discharge: 2019-01-24 | Disposition: A | Discharge status: Home / Self Care | Payer: Managed Care, Other (non HMO) | Source: Ambulatory Visit | Attending: Internal Medicine | Admitting: Internal Medicine

## 2019-01-24 ENCOUNTER — Other Ambulatory Visit: Payer: Self-pay

## 2019-01-24 DIAGNOSIS — Z1231 Encounter for screening mammogram for malignant neoplasm of breast: Secondary | ICD-10-CM | POA: Insufficient documentation

## 2019-02-22 ENCOUNTER — Telehealth: Payer: Self-pay

## 2019-02-22 NOTE — Telephone Encounter (Signed)
Spoke with pt regarding appt on 02/25/19. Pt stated she will check her vitals and did not have any questions at this time.

## 2019-02-25 ENCOUNTER — Telehealth (INDEPENDENT_AMBULATORY_CARE_PROVIDER_SITE_OTHER): Payer: Managed Care, Other (non HMO) | Admitting: Internal Medicine

## 2019-02-25 ENCOUNTER — Encounter: Payer: Self-pay | Admitting: Internal Medicine

## 2019-02-25 VITALS — BP 120/75 | HR 73 | Ht 66.0 in | Wt 205.0 lb

## 2019-02-25 DIAGNOSIS — I4729 Other ventricular tachycardia: Secondary | ICD-10-CM

## 2019-02-25 DIAGNOSIS — I472 Ventricular tachycardia: Secondary | ICD-10-CM

## 2019-02-25 DIAGNOSIS — I493 Ventricular premature depolarization: Secondary | ICD-10-CM | POA: Diagnosis not present

## 2019-02-25 NOTE — Progress Notes (Signed)
Electrophysiology TeleHealth Note   Due to national recommendations of social distancing due to COVID 19, an audio/video telehealth visit is felt to be most appropriate for this patient at this time.  See MyChart message from today for the patient's consent to telehealth for [facility].    Date:  02/25/2019   ID:  Erin Sheppard, DOB [birth year], MRN [id number]  Location: patient's home  Provider location:  [facility]  Evaluation Performed: Follow-up visit  PCP:  Seward Carol, MD   Electrophysiologist:  Dr Rayann Heman  Chief Complaint:  palpitations  History of Present Illness:    Erin Sheppard is a 48 y.o. female who presents via telehealth conferencing today.  Since last being seen in our clinic, the patient reports doing very well.  No symptoms of PVCs. Today, she denies symptoms of palpitations, chest pain, shortness of breath,  lower extremity edema, dizziness, presyncope, or syncope.  The patient is otherwise without complaint today.  The patient denies symptoms of fevers, chills, cough, or new SOB worrisome for COVID 19.  Past Medical History:  Diagnosis Date  . Axillary abscess   . Hernia     Past Surgical History:  Procedure Laterality Date  . CESAREAN SECTION  8/96, 1/06,07/02  . NEPHRECTOMY      Current Outpatient Medications  Medication Sig Dispense Refill  . calcium carbonate (OS-CAL) 600 MG TABS Take 600 mg by mouth 2 (two) times daily with a meal.      . Flaxseed, Linseed, (FLAXSEED OIL) 1000 MG CAPS Take 1 capsule by mouth 2 (two) times daily.    . metoprolol succinate (TOPROL XL) 50 MG 24 hr tablet Take 1 tablet (50 mg total) by mouth daily. Take with or immediately following a meal. 30 tablet 11  . Red Yeast Rice 600 MG CAPS Take 1 capsule by mouth 2 (two) times daily.    . rosuvastatin (CRESTOR) 5 MG tablet Take 1 tablet by mouth daily.     No current facility-administered medications for this visit.     Allergies:   Patient has no known  allergies.   Social History:  The patient  reports that she has never smoked. She has never used smokeless tobacco. She reports that she does not drink alcohol or use drugs.   Family History:  The patient's  family history includes Diabetes in her father; Heart disease in her father; Polycystic kidney disease in her brother; Thyroid disease in her mother.   ROS:  Please see the history of present illness.   All other systems are personally reviewed and negative.    Exam:    Vital Signs:  BP 120/75   Pulse 73   Ht 5\' 6"  (1.676 m)   Wt 205 lb (93 kg)   BMI 33.09 kg/m   Well sounding and appearing, alert and conversant, regular work of breathing,  good skin color Eyes- anicteric, neuro- grossly intact, skin- no apparent rash or lesions or cyanosis, mouth- oral mucosa is pink  Labs/Other Tests and Data Reviewed:    Recent Labs: No results found for requested labs within last 8760 hours.   Wt Readings from Last 3 Encounters:  02/25/19 205 lb (93 kg)  05/16/18 203 lb 12.8 oz (92.4 kg)  04/16/18 197 lb 9.6 oz (89.6 kg)      ASSESSMENT & PLAN:    1.  PVCs/ NSVT Well controlled, minimal symptoms Adequate hydration, stress modification encouraged She can take additional metoprolol prn but has not really  needed this  2. HL Followed by PCP  Follow-up:  Return in a year   Patient Risk:  after full review of this patients clinical status, I feel that they are at moderate risk at this time.  Today, I have spent 15 minutes with the patient with telehealth technology discussing arrhythmia management .    Army Fossa, MD  02/25/2019 11:02 AM     [facility] [address] [place]  [place] [address] [phone] (office) [phone] (fax)

## 2019-03-16 ENCOUNTER — Other Ambulatory Visit: Payer: Self-pay | Admitting: Internal Medicine

## 2019-11-08 IMAGING — MG DIGITAL SCREENING BILATERAL MAMMOGRAM WITH TOMO AND CAD
8 series · 8 of 24 positions shown · non-contrast
Comparison: Previous exam(s).

CLINICAL DATA: Screening.

EXAM:
DIGITAL SCREENING BILATERAL MAMMOGRAM WITH TOMO AND CAD

[L MLO synth-2D]
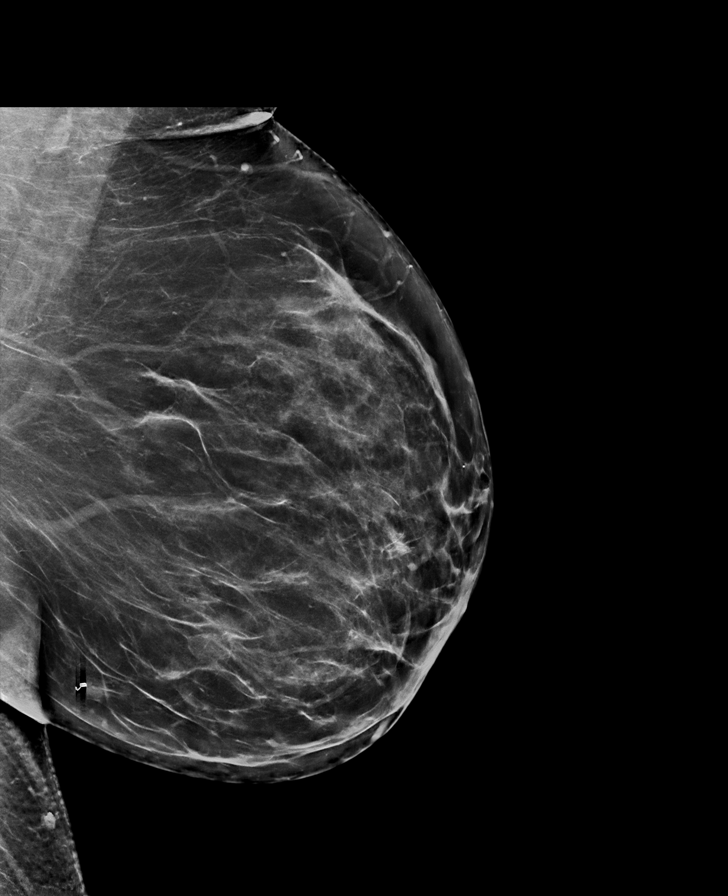

[R MLO synth-2D]
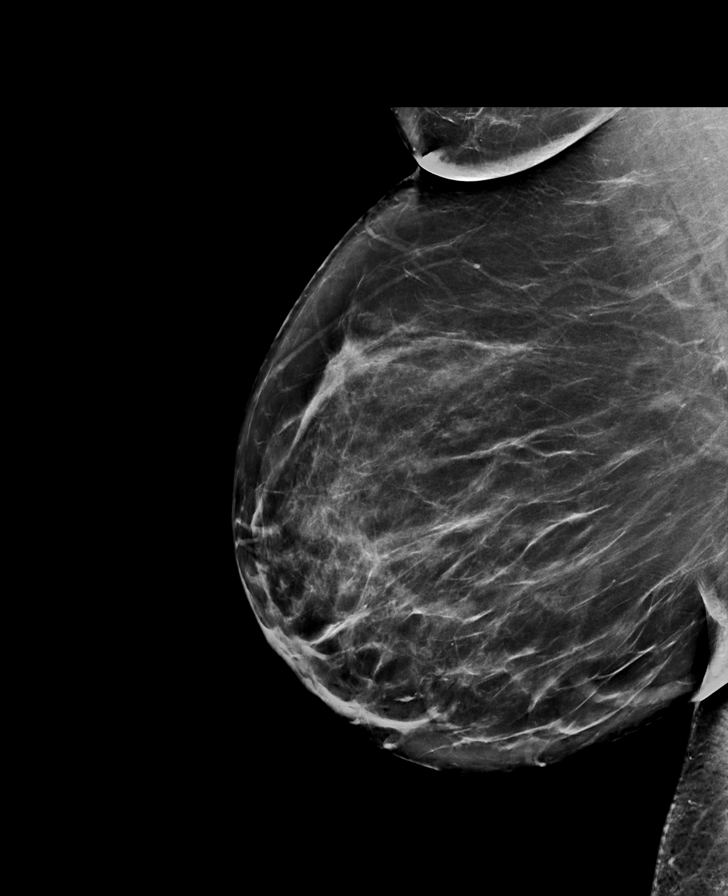

[L CC synth-2D]
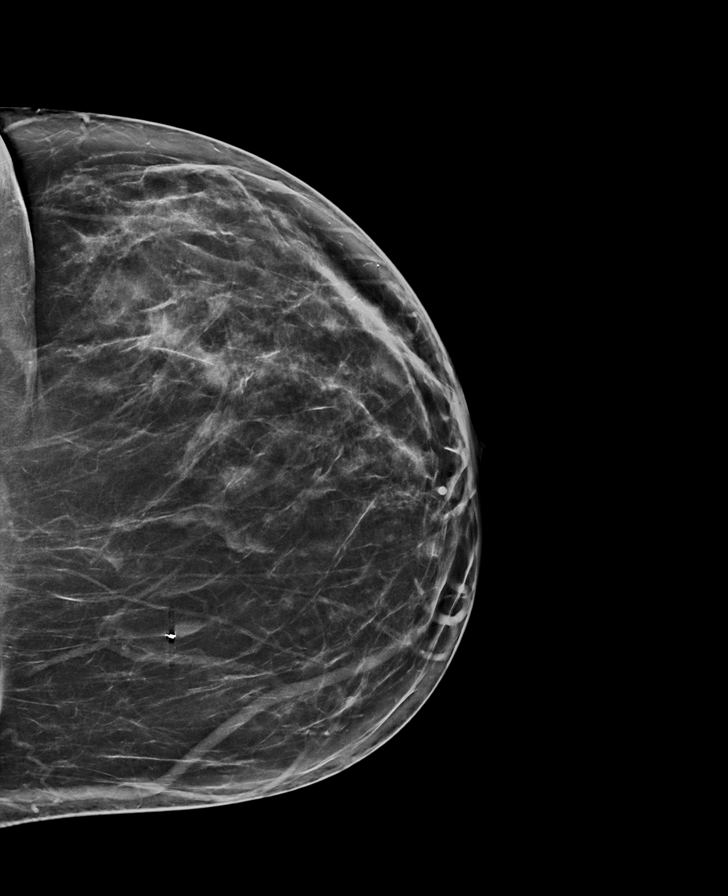

[R CC synth-2D]
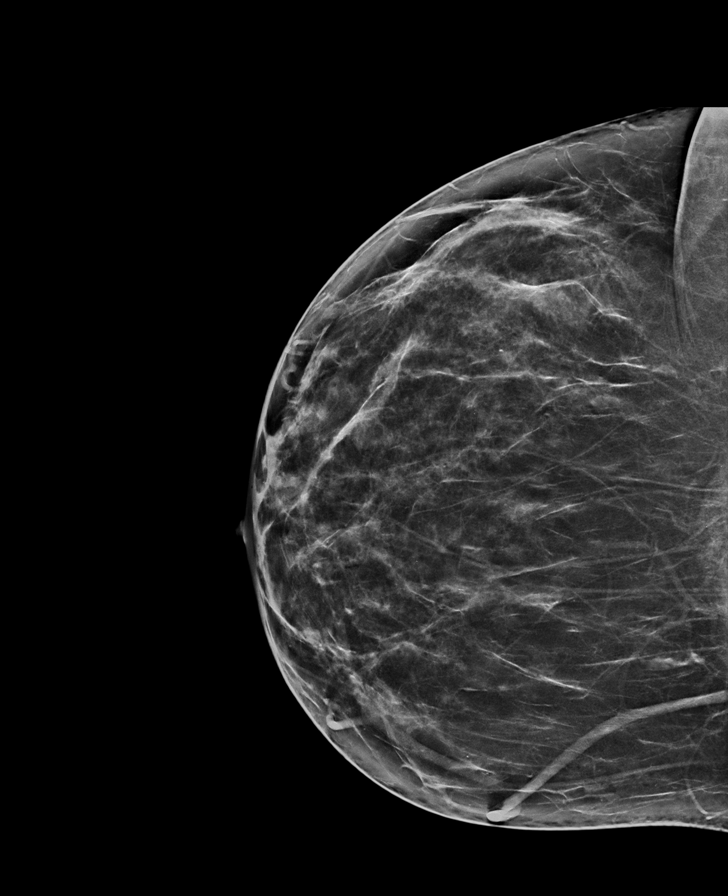

[L MLO tomo · tomo slice 46/91.0]
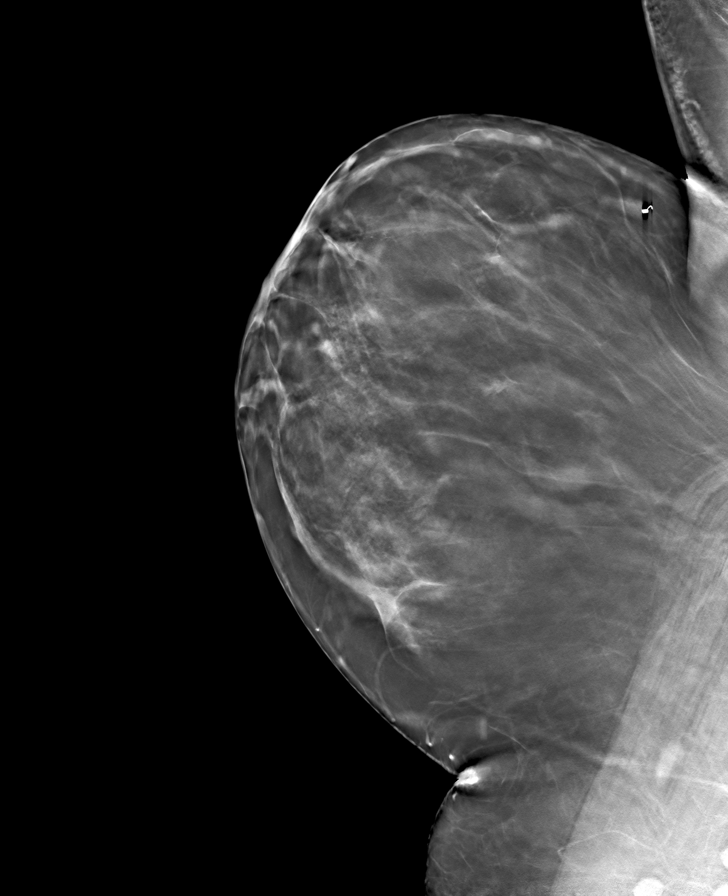

[R MLO tomo · tomo slice 47/93.0]
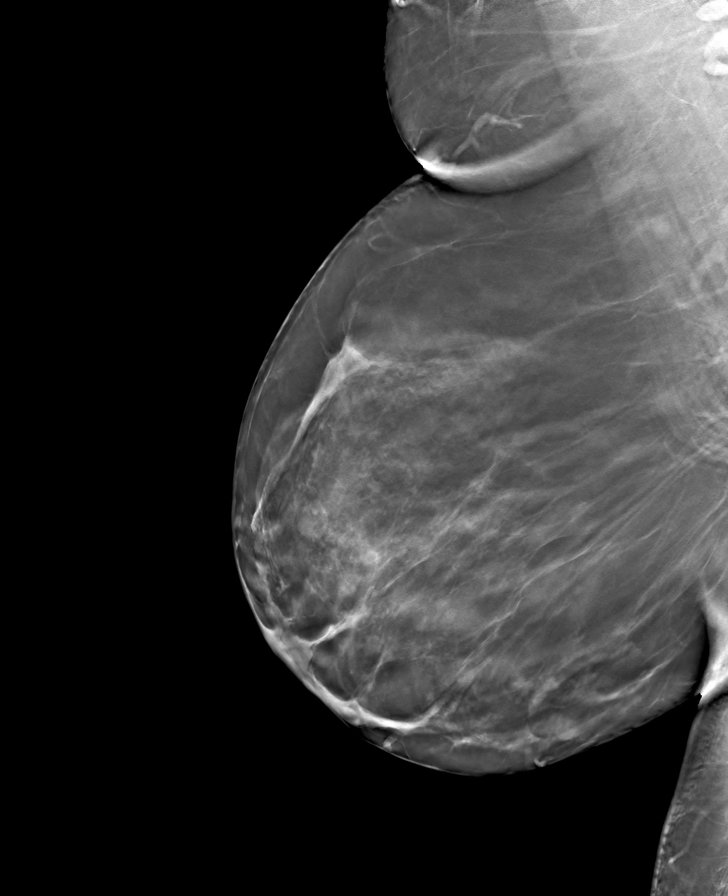

[L CC tomo · tomo slice 39/77.0]
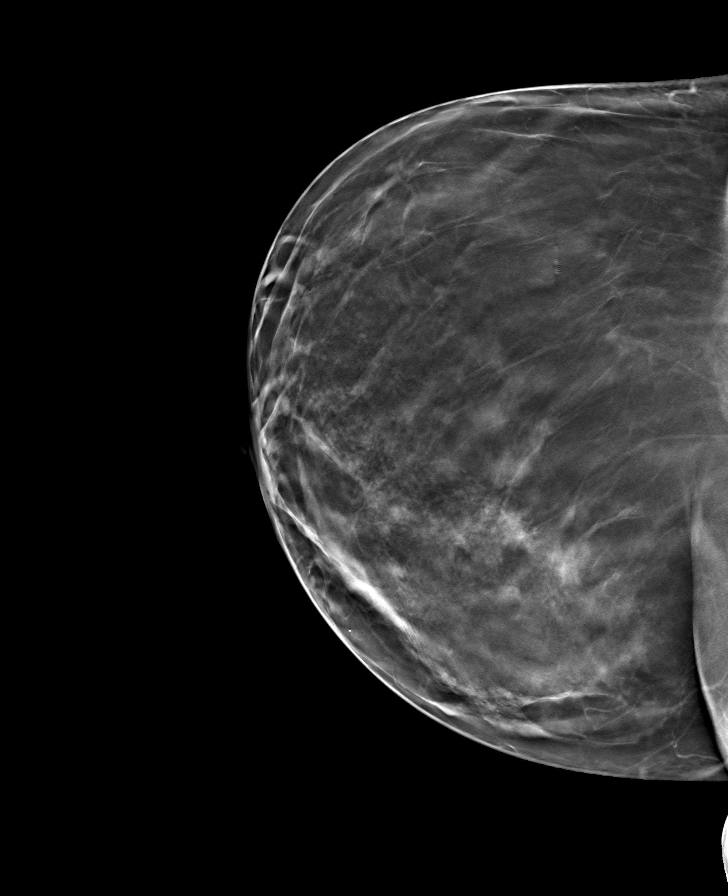

[R CC tomo · tomo slice 36/71.0]
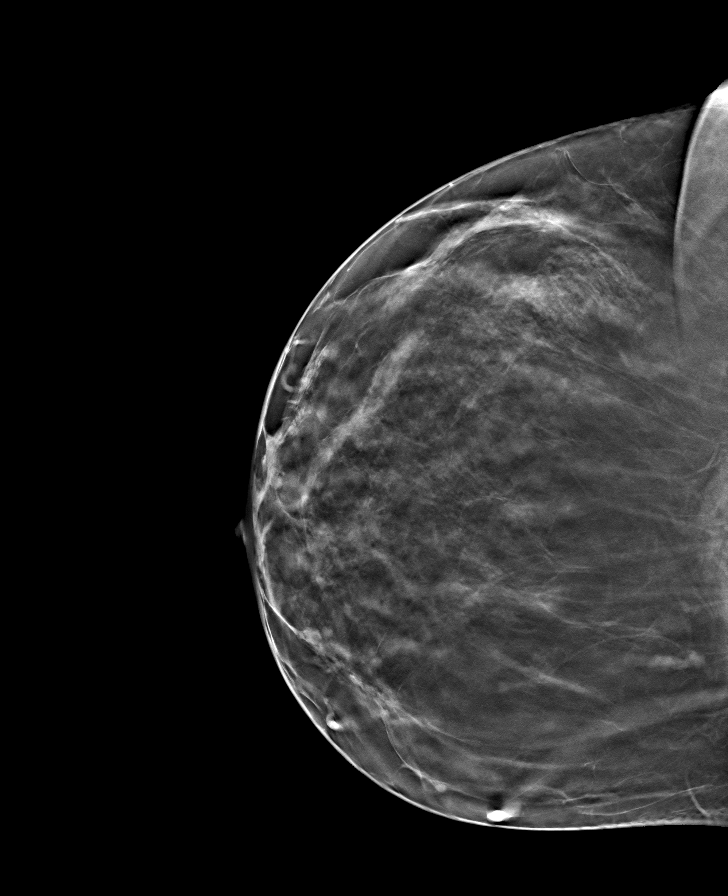

[8 of 24 positions shown; findings below may reference images not displayed]

ACR Breast Density Category c: The breast tissue is heterogeneously
dense, which may obscure small masses.
FINDINGS: There are no findings suspicious for malignancy. Images were
processed with CAD.
IMPRESSION: No mammographic evidence of malignancy. A result letter of this
screening mammogram will be mailed directly to the patient.

RECOMMENDATION:
Screening mammogram in one year. (Code:FT-U-LHB)

BI-RADS CATEGORY  1: Negative.

## 2020-01-13 ENCOUNTER — Other Ambulatory Visit: Payer: Self-pay | Admitting: Internal Medicine

## 2020-01-13 DIAGNOSIS — Z1231 Encounter for screening mammogram for malignant neoplasm of breast: Secondary | ICD-10-CM

## 2020-01-29 ENCOUNTER — Ambulatory Visit
Admission: RE | Admit: 2020-01-29 | Discharge: 2020-01-29 | Disposition: A | Discharge status: Home / Self Care | Payer: Managed Care, Other (non HMO) | Source: Ambulatory Visit

## 2020-01-29 ENCOUNTER — Other Ambulatory Visit: Payer: Self-pay

## 2020-01-29 DIAGNOSIS — Z1231 Encounter for screening mammogram for malignant neoplasm of breast: Secondary | ICD-10-CM

## 2020-02-13 ENCOUNTER — Telehealth: Payer: Self-pay

## 2020-02-13 NOTE — Telephone Encounter (Signed)
Spoke with pt regarding virtual appt on 02/17/20. Pt agreed and confirmed appt.

## 2020-02-17 ENCOUNTER — Telehealth (INDEPENDENT_AMBULATORY_CARE_PROVIDER_SITE_OTHER): Payer: Managed Care, Other (non HMO) | Admitting: Internal Medicine

## 2020-02-17 ENCOUNTER — Other Ambulatory Visit: Payer: Self-pay

## 2020-02-17 DIAGNOSIS — I4729 Other ventricular tachycardia: Secondary | ICD-10-CM

## 2020-02-17 DIAGNOSIS — E7800 Pure hypercholesterolemia, unspecified: Secondary | ICD-10-CM | POA: Diagnosis not present

## 2020-02-17 DIAGNOSIS — I472 Ventricular tachycardia: Secondary | ICD-10-CM

## 2020-02-17 DIAGNOSIS — I493 Ventricular premature depolarization: Secondary | ICD-10-CM

## 2020-02-17 NOTE — Progress Notes (Signed)
     Electrophysiology TeleHealth Note   Due to national recommendations of social distancing due to COVID 19, an audio/video telehealth visit is felt to be most appropriate for this patient at this time.  See MyChart message from today for the patient's consent to telehealth for Erin Sheppard.  Date:  02/17/2020   ID:  Erin Sheppard, DOB 21-Aug-1970, MRN [id number]  Location: patient's home  Provider location:  [facility] [place]  Evaluation Performed: Follow-up visit  PCP:  Seward Carol, MD   Electrophysiologist:  Dr Rayann Heman  Chief Complaint:  palpitations  History of Present Illness:    Erin Sheppard is a 49 y.o. female who presents via telehealth conferencing today.  Since last being seen in our clinic, the patient reports doing very well.  Today, she denies symptoms of palpitations, chest pain, shortness of breath,  lower extremity edema, dizziness, presyncope, or syncope.  The patient is otherwise without complaint today.   Past Medical History:  Diagnosis Date  . Axillary abscess   . Hernia     Past Surgical History:  Procedure Laterality Date  . CESAREAN SECTION  8/96, 1/06,07/02  . NEPHRECTOMY      Current Outpatient Medications  Medication Sig Dispense Refill  . calcium carbonate (OS-CAL) 600 MG TABS Take 600 mg by mouth 2 (two) times daily with a meal.      . Flaxseed, Linseed, (FLAXSEED OIL) 1000 MG CAPS Take 1 capsule by mouth 2 (two) times daily.    . metoprolol succinate (TOPROL-XL) 50 MG 24 hr tablet TAKE ONE TABLET BY MOUTH DAILY .TAKE WITH OR IMMEDIATELY FOLLOWING A MEAL 90 tablet 3  . Red Yeast Rice 600 MG CAPS Take 1 capsule by mouth 2 (two) times daily.    . rosuvastatin (CRESTOR) 5 MG tablet Take 1 tablet by mouth daily.     No current facility-administered medications for this visit.    Allergies:   Patient has no known allergies.   Social History:  The patient  reports that she has never smoked. She has never used smokeless tobacco. She reports  that she does not drink alcohol and does not use drugs.   ROS:  Please see the history of present illness.   All other systems are personally reviewed and negative.    Exam:    Vital Signs:  There were no vitals taken for this visit.  Well sounding and appearing, alert and conversant, regular work of breathing,  good skin color Eyes- anicteric, neuro- grossly intact, skin- no apparent rash or lesions or cyanosis, mouth- oral mucosa is pink  Labs/Other Tests and Data Reviewed:    Recent Labs: No results found for requested labs within last 8760 hours.   Wt Readings from Last 3 Encounters:  02/25/19 205 lb (93 kg)  05/16/18 203 lb 12.8 oz (92.4 kg)  04/16/18 197 lb 9.6 oz (89.6 kg)     ASSESSMENT & PLAN:    1.  PVCs/ NSVT Well controlled No changes  2. HL Continue crestor    Follow-up:  12 months to see EP APP   Patient Risk:  after full review of this patients clinical status, I feel that they are at moderate risk at this time.  Today, I have spent 15 minutes with the patient with telehealth technology discussing arrhythmia management .    Army Fossa, MD  02/17/2020 8:59 AM     [facility] [address] [place] [address] [phone] (office) [phone] (fax)

## 2020-04-22 ENCOUNTER — Other Ambulatory Visit: Payer: Self-pay | Admitting: Internal Medicine

## 2020-05-01 ENCOUNTER — Ambulatory Visit: Payer: Managed Care, Other (non HMO) | Attending: Internal Medicine

## 2020-05-01 DIAGNOSIS — Z23 Encounter for immunization: Secondary | ICD-10-CM

## 2020-05-01 NOTE — Progress Notes (Signed)
   Covid-19 Vaccination Clinic  Name:  Erin Sheppard    MRN: [id number] DOB: Jun 13, 1970  05/01/2020  [name] was observed post Covid-19 immunization for 15 minutes without incident. She was provided with Vaccine Information Sheet and instruction to access the V-Safe system.   [name] was instructed to call 911 with any severe reactions post vaccine: Marland Kitchen Difficulty breathing  . Swelling of face and throat  . A fast heartbeat  . A bad rash all over body  . Dizziness and weakness   Immunizations Administered    No immunizations on file.

## 2021-01-27 ENCOUNTER — Other Ambulatory Visit: Payer: Self-pay | Admitting: Internal Medicine

## 2021-02-18 ENCOUNTER — Other Ambulatory Visit (HOSPITAL_BASED_OUTPATIENT_CLINIC_OR_DEPARTMENT_OTHER): Payer: Self-pay | Admitting: Internal Medicine

## 2021-02-18 DIAGNOSIS — Z1231 Encounter for screening mammogram for malignant neoplasm of breast: Secondary | ICD-10-CM

## 2021-02-24 ENCOUNTER — Other Ambulatory Visit: Payer: Self-pay

## 2021-02-24 ENCOUNTER — Ambulatory Visit: Payer: Managed Care, Other (non HMO)

## 2021-02-24 DIAGNOSIS — Z1231 Encounter for screening mammogram for malignant neoplasm of breast: Secondary | ICD-10-CM

## 2021-03-01 ENCOUNTER — Encounter: Payer: Self-pay | Admitting: Internal Medicine

## 2021-03-01 ENCOUNTER — Other Ambulatory Visit: Payer: Self-pay | Admitting: Internal Medicine

## 2021-03-01 ENCOUNTER — Other Ambulatory Visit: Payer: Self-pay

## 2021-03-01 ENCOUNTER — Ambulatory Visit (INDEPENDENT_AMBULATORY_CARE_PROVIDER_SITE_OTHER): Payer: Managed Care, Other (non HMO) | Admitting: Internal Medicine

## 2021-03-01 VITALS — BP 114/84 | HR 67 | Ht 66.0 in | Wt 213.8 lb

## 2021-03-01 DIAGNOSIS — R928 Other abnormal and inconclusive findings on diagnostic imaging of breast: Secondary | ICD-10-CM

## 2021-03-01 DIAGNOSIS — I472 Ventricular tachycardia: Secondary | ICD-10-CM

## 2021-03-01 DIAGNOSIS — E7800 Pure hypercholesterolemia, unspecified: Secondary | ICD-10-CM

## 2021-03-01 DIAGNOSIS — I493 Ventricular premature depolarization: Secondary | ICD-10-CM

## 2021-03-01 DIAGNOSIS — I4729 Other ventricular tachycardia: Secondary | ICD-10-CM | POA: Diagnosis not present

## 2021-03-01 NOTE — Patient Instructions (Signed)
Medication Instructions:  Your physician recommends that you continue on your current medications as directed. Please refer to the Current Medication list given to you today. *If you need a refill on your cardiac medications before your next appointment, please call your pharmacy*  Lab Work: None. If you have labs (blood work) drawn today and your tests are completely normal, you will receive your results only by: [name] (if you have MyChart) OR A paper copy in the mail If you have any lab test that is abnormal or we need to change your treatment, we will call you to review the results.  Testing/Procedures: None.  Follow-Up: At [facility], you and your health needs are our priority.  As part of our continuing mission to provide you with exceptional heart care, we have created designated Provider Care Teams.  These Care Teams include your primary Cardiologist (physician) and Advanced Practice Providers (APPs -  Physician Assistants and Nurse Practitioners) who all work together to provide you with the care you need, when you need it.  Your physician wants you to follow-up in: 12 months with  Erin Grayer, MD or one of the following Advanced Practice Providers on your designated Care Team:    Erin Standard, PA-C    You will receive a reminder letter in the mail two months in advance. If you don't receive a letter, please call our office to schedule the follow-up appointment.  We recommend signing up for the patient portal called "MyChart".  Sign up information is provided on this After Visit Summary.  MyChart is used to connect with patients for Virtual Visits (Telemedicine).  Patients are able to view lab/test results, encounter notes, upcoming appointments, etc.  Non-urgent messages can be sent to your provider as well.   To learn more about what you can do with MyChart, go to [email or web address].    Any Other Special Instructions Will Be Listed Below (If  Applicable).

## 2021-03-01 NOTE — Progress Notes (Signed)
   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 female who presents today for routine electrophysiology followup.  Since last being seen in our clinic, the patient reports doing very well.  Today, she denies symptoms of palpitations, chest pain, shortness of breath,  lower extremity edema, dizziness, presyncope, or syncope.  The patient is otherwise without complaint today.   Past Medical History:  Diagnosis Date   Axillary abscess    Hernia    Past Surgical History:  Procedure Laterality Date   CESAREAN SECTION  8/96, 1/06,07/02   NEPHRECTOMY      ROS- all systems are reviewed and negatives except as per HPI above  Current Outpatient Medications  Medication Sig Dispense Refill   metoprolol succinate (TOPROL-XL) 50 MG 24 hr tablet TAKE ONE TABLET BY MOUTH DAILY TAKE WITH OR IMMEDIATELY FOLLOWING A MEAL 90 tablet 0   polyethylene glycol-electrolytes (NULYTELY) 420 g solution Take 4,000 mLs by mouth as directed.     rosuvastatin (CRESTOR) 5 MG tablet Take 1 tablet by mouth daily.     calcium carbonate (OS-CAL) 600 MG TABS Take 600 mg by mouth 2 (two) times daily with a meal.       Flaxseed, Linseed, (FLAXSEED OIL) 1000 MG CAPS Take 1 capsule by mouth 2 (two) times daily.     Red Yeast Rice 600 MG CAPS Take 1 capsule by mouth 2 (two) times daily.     No current facility-administered medications for this visit.    Physical Exam: Vitals:   03/01/21 1059  BP: 114/84  Pulse: 67  SpO2: 97%  Weight: 213 lb 12.8 oz (97 kg)  Height: 5\' 6"  (1.676 m)    GEN- The patient is well appearing, alert and oriented x 3 today.   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 She finds that taking an additional half of a metoprolol will typical improve this  2. HL Continue crestor 5mg  dailly  Return to see EP APP in a year  Thompson Grayer MD, [facility] 03/01/2021 11:19 AM

## 2021-03-22 ENCOUNTER — Other Ambulatory Visit: Payer: Self-pay | Admitting: Internal Medicine

## 2021-03-22 ENCOUNTER — Ambulatory Visit
Admission: RE | Admit: 2021-03-22 | Discharge: 2021-03-22 | Disposition: A | Discharge status: Home / Self Care | Payer: Managed Care, Other (non HMO) | Source: Ambulatory Visit | Attending: Internal Medicine | Admitting: Internal Medicine

## 2021-03-22 ENCOUNTER — Other Ambulatory Visit: Payer: Self-pay

## 2021-03-22 DIAGNOSIS — N6320 Unspecified lump in the left breast, unspecified quadrant: Secondary | ICD-10-CM

## 2021-03-22 DIAGNOSIS — R928 Other abnormal and inconclusive findings on diagnostic imaging of breast: Secondary | ICD-10-CM

## 2021-03-22 DIAGNOSIS — N6310 Unspecified lump in the right breast, unspecified quadrant: Secondary | ICD-10-CM

## 2021-03-25 ENCOUNTER — Other Ambulatory Visit: Payer: Self-pay

## 2021-03-25 ENCOUNTER — Ambulatory Visit
Admission: RE | Admit: 2021-03-25 | Discharge: 2021-03-25 | Disposition: A | Discharge status: Home / Self Care | Payer: Managed Care, Other (non HMO) | Source: Ambulatory Visit | Attending: Internal Medicine | Admitting: Internal Medicine

## 2021-03-25 ENCOUNTER — Other Ambulatory Visit: Payer: Self-pay | Admitting: Internal Medicine

## 2021-03-25 DIAGNOSIS — N6320 Unspecified lump in the left breast, unspecified quadrant: Secondary | ICD-10-CM

## 2021-03-25 DIAGNOSIS — N6310 Unspecified lump in the right breast, unspecified quadrant: Secondary | ICD-10-CM

## 2021-03-25 HISTORY — PX: BREAST BIOPSY: SHX20

## 2021-04-20 ENCOUNTER — Other Ambulatory Visit: Payer: Self-pay | Admitting: Internal Medicine

## 2021-09-16 ENCOUNTER — Other Ambulatory Visit: Payer: Self-pay | Admitting: Surgery

## 2021-09-16 DIAGNOSIS — D242 Benign neoplasm of left breast: Secondary | ICD-10-CM

## 2021-09-16 DIAGNOSIS — D241 Benign neoplasm of right breast: Secondary | ICD-10-CM

## 2021-10-07 ENCOUNTER — Other Ambulatory Visit: Payer: Self-pay | Admitting: Surgery

## 2021-10-07 ENCOUNTER — Ambulatory Visit
Admission: RE | Admit: 2021-10-07 | Discharge: 2021-10-07 | Disposition: A | Discharge status: Home / Self Care | Payer: Managed Care, Other (non HMO) | Source: Ambulatory Visit | Attending: Surgery | Admitting: Surgery

## 2021-10-07 ENCOUNTER — Ambulatory Visit: Payer: Managed Care, Other (non HMO)

## 2021-10-07 DIAGNOSIS — D241 Benign neoplasm of right breast: Secondary | ICD-10-CM

## 2021-10-07 DIAGNOSIS — D242 Benign neoplasm of left breast: Secondary | ICD-10-CM

## 2022-03-15 ENCOUNTER — Other Ambulatory Visit: Payer: Self-pay | Admitting: Obstetrics and Gynecology

## 2022-04-11 ENCOUNTER — Ambulatory Visit
Admission: RE | Admit: 2022-04-11 | Discharge: 2022-04-11 | Disposition: A | Discharge status: Home / Self Care | Payer: Managed Care, Other (non HMO) | Source: Ambulatory Visit | Attending: Surgery | Admitting: Surgery

## 2022-04-11 ENCOUNTER — Other Ambulatory Visit: Payer: Managed Care, Other (non HMO)

## 2022-04-11 DIAGNOSIS — D242 Benign neoplasm of left breast: Secondary | ICD-10-CM

## 2022-04-11 DIAGNOSIS — D241 Benign neoplasm of right breast: Secondary | ICD-10-CM

## 2022-06-28 ENCOUNTER — Other Ambulatory Visit: Payer: Self-pay | Admitting: Obstetrics and Gynecology

## 2023-03-23 ENCOUNTER — Other Ambulatory Visit: Payer: Self-pay | Admitting: Surgery

## 2023-03-23 DIAGNOSIS — N6320 Unspecified lump in the left breast, unspecified quadrant: Secondary | ICD-10-CM

## 2023-04-19 ENCOUNTER — Ambulatory Visit
Admission: RE | Admit: 2023-04-19 | Discharge: 2023-04-19 | Disposition: A | Discharge status: Home / Self Care | Payer: Managed Care, Other (non HMO) | Source: Ambulatory Visit | Attending: Surgery | Admitting: Surgery

## 2023-04-19 DIAGNOSIS — Z1231 Encounter for screening mammogram for malignant neoplasm of breast: Secondary | ICD-10-CM

## 2023-04-19 DIAGNOSIS — N6320 Unspecified lump in the left breast, unspecified quadrant: Secondary | ICD-10-CM

## 2023-04-26 ENCOUNTER — Other Ambulatory Visit: Payer: Self-pay | Admitting: Medical Genetics

## 2023-05-03 ENCOUNTER — Other Ambulatory Visit: Payer: Self-pay | Admitting: Medical Genetics

## 2023-05-11 ENCOUNTER — Other Ambulatory Visit: Payer: Self-pay | Admitting: *Deleted

## 2023-06-14 ENCOUNTER — Other Ambulatory Visit (HOSPITAL_COMMUNITY)
Admission: RE | Admit: 2023-06-14 | Discharge: 2023-06-14 | Disposition: A | Discharge status: Home / Self Care | Payer: Self-pay | Source: Ambulatory Visit | Attending: Medical Genetics | Admitting: Medical Genetics

## 2023-06-23 LAB — GENECONNECT MOLECULAR SCREEN: Genetic Analysis Overall Interpretation: NEGATIVE

## 2024-03-22 ENCOUNTER — Other Ambulatory Visit: Payer: Self-pay | Admitting: Internal Medicine

## 2024-03-22 DIAGNOSIS — Z1231 Encounter for screening mammogram for malignant neoplasm of breast: Secondary | ICD-10-CM

## 2024-04-18 ENCOUNTER — Encounter

## 2024-04-19 ENCOUNTER — Encounter

## 2024-04-19 DIAGNOSIS — Z1231 Encounter for screening mammogram for malignant neoplasm of breast: Secondary | ICD-10-CM

## 2024-05-01 ENCOUNTER — Ambulatory Visit
Admission: RE | Admit: 2024-05-01 | Discharge: 2024-05-01 | Disposition: A | Source: Ambulatory Visit | Attending: Internal Medicine | Admitting: Internal Medicine

## 2024-05-01 DIAGNOSIS — Z1231 Encounter for screening mammogram for malignant neoplasm of breast: Secondary | ICD-10-CM
